# Patient Record
Sex: Female | Born: 1950 | Race: Black or African American | Hispanic: No | Marital: Single | Smoking: Never smoker
Health system: Southern US, Community
[De-identification: ages and names within clinical notes are randomized; demographics above are authoritative.]

## PROBLEM LIST (undated history)

## (undated) DIAGNOSIS — E119 Type 2 diabetes mellitus without complications: Secondary | ICD-10-CM

## (undated) DIAGNOSIS — I10 Essential (primary) hypertension: Secondary | ICD-10-CM

## (undated) DIAGNOSIS — E079 Disorder of thyroid, unspecified: Secondary | ICD-10-CM

## (undated) DIAGNOSIS — M5430 Sciatica, unspecified side: Secondary | ICD-10-CM

## (undated) HISTORY — DX: Disorder of thyroid, unspecified: E07.9

## (undated) HISTORY — DX: Essential (primary) hypertension: I10

## (undated) HISTORY — DX: Type 2 diabetes mellitus without complications: E11.9

---

## 2012-06-12 ENCOUNTER — Ambulatory Visit: Payer: Self-pay | Admitting: Family Medicine

## 2013-04-24 ENCOUNTER — Ambulatory Visit: Payer: Self-pay | Admitting: General Surgery

## 2013-05-19 ENCOUNTER — Encounter: Payer: Self-pay | Admitting: General Surgery

## 2013-05-19 ENCOUNTER — Telehealth: Payer: Self-pay

## 2013-05-19 ENCOUNTER — Ambulatory Visit (INDEPENDENT_AMBULATORY_CARE_PROVIDER_SITE_OTHER): Payer: Medicaid Other | Admitting: General Surgery

## 2013-05-19 VITALS — BP 150/72 | HR 70 | Resp 14 | Ht 61.0 in | Wt <= 1120 oz

## 2013-05-19 DIAGNOSIS — K8020 Calculus of gallbladder without cholecystitis without obstruction: Secondary | ICD-10-CM

## 2013-05-19 NOTE — Telephone Encounter (Signed)
Patient has been scheduled for surgery at [facility] on 05/30/13. She will go to pre [facility] 05/22/13 at 8:30 am. She is aware of dates, time, and all instructions.

## 2013-05-19 NOTE — Progress Notes (Signed)
Patient ID: Gina Huerta, female   DOB: [birth year], 63 y.o.   MRN: [id number]  Chief Complaint  Patient presents with  . Other    gallstones    HPI Gina Huerta is a 63 y.o. female. here today for a evalaution of gallstones. Patient had a in house ultrasound done in Dr. Melford AaseNieneyer office.Pt had pain from mid upper abdomen that wrapped around to right midback, describes it like a pulling sensation.  Pt points to RUQ as location of most severe pain. Rated 8/10 at onset of pain in March. Pt feels uncomfortable at bedtime and reports pain 10 minutes after most meals/dinners that lasts less than an hour. Pt unsure if there are any trigger foods. Pt takes Advil for pain relief. Pt denies nausea or vomiting.      HPI   Past Medical History  Diagnosis Date  . Diabetes mellitus without complication   . Hypertension   . Thyroid disease     History reviewed. No pertinent past surgical history.  History reviewed. No pertinent family history.  Social History History  Substance Use Topics  . Smoking status: Never Smoker   . Smokeless tobacco: Never Used  . Alcohol Use: No    No Known Allergies  Current Outpatient Prescriptions  Medication Sig Dispense Refill  . alendronate (FOSAMAX) 70 MG tablet Take 70 mg by mouth once a week. Take with a full glass of water on an empty stomach.      Marland Kitchen. amLODipine (NORVASC) 5 MG tablet Take 5 mg by mouth daily.      Marland Kitchen. aspirin 325 MG tablet Take 325 mg by mouth daily.      . carvedilol (COREG) 25 MG tablet Take 25 mg by mouth 2 (two) times daily with a meal.      . carvedilol (COREG) 3.125 MG tablet Take 3.125 mg by mouth 2 (two) times daily with a meal.      . cholecalciferol (VITAMIN D) 400 UNITS TABS tablet Take 400 Units by mouth.      . levothyroxine (SYNTHROID, LEVOTHROID) 25 MCG tablet Take 25 mcg by mouth daily before breakfast.      . lisinopril (PRINIVIL,ZESTRIL) 10 MG tablet Take 10 mg by mouth daily.      . metFORMIN (GLUCOPHAGE) 500 MG tablet Take  500 mg by mouth daily with breakfast.      . simvastatin (ZOCOR) 40 MG tablet Take 40 mg by mouth daily.      Marland Kitchen. tiZANidine (ZANAFLEX) 4 MG capsule Take 4 mg by mouth 3 (three) times daily.       No current facility-administered medications for this visit.    Review of Systems Review of Systems  Constitutional: Negative.  Negative for fever and chills.  Respiratory: Positive for cough.   Cardiovascular: Negative.   Gastrointestinal: Positive for abdominal pain. Negative for nausea, vomiting, diarrhea and constipation.  Musculoskeletal: Positive for back pain.    Blood pressure 150/72, pulse 70, resp. rate 14, height 5\' 1"  (1.549 m), weight 26 lb (11.794 kg).  Physical Exam Physical Exam  Constitutional: She is oriented to person, place, and time. She appears well-developed and well-nourished.  HENT:  Head: Normocephalic and atraumatic.  Eyes: Conjunctivae are normal. No scleral icterus.  Neck: Neck supple. No mass present.  Cardiovascular: Normal rate, regular rhythm and normal heart sounds.  Exam reveals no gallop and no friction rub.   No murmur heard. Pulmonary/Chest: Effort normal and breath sounds normal. No respiratory distress.  Abdominal: Soft. Normal appearance  and bowel sounds are normal. She exhibits no distension and no mass. There is no splenomegaly or hepatomegaly. There is tenderness in the right upper quadrant. There is no rigidity, no guarding and negative Murphy's sign.    Lymphadenopathy:    She has no cervical adenopathy.  Neurological: She is alert and oriented to person, place, and time.  Skin: Skin is warm and dry.    Data Reviewed Ultrasound reviewed-gallstones noted.  Assessment     Cholelithiasis, symptomatic.      Plan   Discussed  Laparoscopy and Cholecystectomy procedure, indications, risks, benefits. Pt agreeable.  Blood work includes CBC, CMP, and Lipase today.        Aricka Goldberger G Johanny Segers 05/19/2013, 1:22 PM

## 2013-05-19 NOTE — Addendum Note (Signed)
Addended by: Kieth BrightlySANKAR, SEEPLAPUTHUR G on: 05/19/2013 04:36 PM   Modules accepted: Orders

## 2013-05-19 NOTE — Patient Instructions (Signed)
Laparoscopic Cholecystectomy °Laparoscopic cholecystectomy is surgery to remove the gallbladder. The gallbladder is located in the upper right part of the abdomen, behind the liver. It is a storage sac for bile produced in the liver. Bile aids in the digestion and absorption of fats. Cholecystectomy is often done for inflammation of the gallbladder (cholecystitis). This condition is usually caused by a buildup of gallstones (cholelithiasis) in your gallbladder. Gallstones can block the flow of bile, resulting in inflammation and pain. In severe cases, emergency surgery may be required. When emergency surgery is not required, you will have time to prepare for the procedure. °Laparoscopic surgery is an alternative to open surgery. Laparoscopic surgery has a shorter recovery time. Your common bile duct may also need to be examined during the procedure. If stones are found in the common bile duct, they may be removed. °LET YOUR HEALTH CARE PROVIDER KNOW ABOUT: °· Any allergies you have. °· All medicines you are taking, including vitamins, herbs, eye drops, creams, and over-the-counter medicines. °· Previous problems you or members of your family have had with the use of anesthetics. °· Any blood disorders you have. °· Previous surgeries you have had. °· Medical conditions you have. °RISKS AND COMPLICATIONS °Generally, this is a safe procedure. However, as with any procedure, complications can occur. Possible complications include: °· Infection. °· Damage to the common bile duct, nerves, arteries, veins, or other internal organs such as the stomach, liver, or intestines. °· Bleeding. °· A stone may remain in the common bile duct. °· A bile leak from the cyst duct that is clipped when your gallbladder is removed. °· The need to convert to open surgery, which requires a larger incision in the abdomen. This may be necessary if your surgeon thinks it is not safe to continue with a laparoscopic procedure. °BEFORE THE  PROCEDURE °· Ask your health care provider about changing or stopping any regular medicines. You will need to stop taking aspirin or blood thinners at least 5 days prior to surgery. °· Do not eat or drink anything after midnight the night before surgery. °· Let your health care provider know if you develop a cold or other infectious problem before surgery. °PROCEDURE  °· You will be given medicine to make you sleep through the procedure (general anesthetic). A breathing tube will be placed in your mouth. °· When you are asleep, your surgeon will make several small cuts (incisions) in your abdomen. °· A thin, lighted tube with a tiny camera on the end (laparoscope) is inserted through one of the small incisions. The camera on the laparoscope sends a picture to a TV screen in the operating room. This gives the surgeon a good view inside your abdomen. °· A gas will be pumped into your abdomen. This expands your abdomen so that the surgeon has more room to perform the surgery. °· Other tools needed for the procedure are inserted through the other incisions. The gallbladder is removed through one of the incisions. °· After the removal of your gallbladder, the incisions will be closed with stitches, staples, or skin glue. °AFTER THE PROCEDURE °· You will be taken to a recovery area where your progress will be checked often. °· You may be allowed to go home the same day if your pain is controlled and you can tolerate liquids. °Document Released: 01/02/2005 Document Revised: 10/23/2012 Document Reviewed: 08/14/2012 °[employer]® Patient Information ©2014 [employer]. ° °

## 2013-05-20 LAB — COMPREHENSIVE METABOLIC PANEL
ALK PHOS: 80 IU/L (ref 39–117)
ALT: 9 IU/L (ref 0–32)
AST: 13 IU/L (ref 0–40)
Albumin/Globulin Ratio: 1.2 (ref 1.1–2.5)
Albumin: 4 g/dL (ref 3.6–4.8)
BILIRUBIN TOTAL: 0.3 mg/dL (ref 0.0–1.2)
BUN / CREAT RATIO: 19 (ref 11–26)
BUN: 13 mg/dL (ref 8–27)
CHLORIDE: 97 mmol/L (ref 97–108)
CO2: 27 mmol/L (ref 18–29)
Calcium: 9.7 mg/dL (ref 8.7–10.3)
Creatinine, Ser: 0.68 mg/dL (ref 0.57–1.00)
GFR calc non Af Amer: 93 mL/min/{1.73_m2} (ref >59)
GFR, EST AFRICAN AMERICAN: 107 mL/min/{1.73_m2} (ref >59)
GLUCOSE: 136 mg/dL — AB (ref 65–99)
Globulin, Total: 3.4 g/dL (ref 1.5–4.5)
Potassium: 4.5 mmol/L (ref 3.5–5.2)
SODIUM: 142 mmol/L (ref 134–144)
Total Protein: 7.4 g/dL (ref 6.0–8.5)

## 2013-05-20 LAB — CBC WITH DIFFERENTIAL
BASOS ABS: 0 10*3/uL (ref 0.0–0.2)
Basos: 0 %
EOS ABS: 0.2 10*3/uL (ref 0.0–0.4)
Eos: 2 %
HCT: 35.1 % (ref 34.0–46.6)
Hemoglobin: 11.3 g/dL (ref 11.1–15.9)
IMMATURE GRANULOCYTES: 0 %
Immature Grans (Abs): 0 10*3/uL (ref 0.0–0.1)
LYMPHS: 24 %
Lymphocytes Absolute: 3.2 10*3/uL — ABNORMAL HIGH (ref 0.7–3.1)
MCH: 27.5 pg (ref 26.6–33.0)
MCHC: 32.2 g/dL (ref 31.5–35.7)
MCV: 85 fL (ref 79–97)
Monocytes Absolute: 0.9 10*3/uL (ref 0.1–0.9)
Monocytes: 7 %
Neutrophils Absolute: 8.8 10*3/uL — ABNORMAL HIGH (ref 1.4–7.0)
Neutrophils Relative %: 67 %
PLATELETS: 237 10*3/uL (ref 150–379)
RBC: 4.11 x10E6/uL (ref 3.77–5.28)
RDW: 13.8 % (ref 12.3–15.4)
WBC: 13.2 10*3/uL — ABNORMAL HIGH (ref 3.4–10.8)

## 2013-05-21 LAB — LIPASE: LIPASE: 40 U/L (ref 0–59)

## 2013-05-21 LAB — SPECIMEN STATUS REPORT

## 2013-05-22 ENCOUNTER — Ambulatory Visit: Payer: Self-pay | Admitting: General Surgery

## 2013-05-30 ENCOUNTER — Ambulatory Visit: Payer: Self-pay | Admitting: General Surgery

## 2013-05-30 ENCOUNTER — Encounter: Payer: Self-pay | Admitting: General Surgery

## 2013-05-30 DIAGNOSIS — K8010 Calculus of gallbladder with chronic cholecystitis without obstruction: Secondary | ICD-10-CM

## 2013-05-30 HISTORY — PX: CHOLECYSTECTOMY: SHX55

## 2013-06-03 LAB — PATHOLOGY REPORT

## 2013-06-04 ENCOUNTER — Encounter: Payer: Self-pay | Admitting: General Surgery

## 2013-06-12 ENCOUNTER — Ambulatory Visit (INDEPENDENT_AMBULATORY_CARE_PROVIDER_SITE_OTHER): Payer: Self-pay | Admitting: General Surgery

## 2013-06-12 ENCOUNTER — Encounter: Payer: Self-pay | Admitting: General Surgery

## 2013-06-12 VITALS — BP 148/78 | HR 72 | Resp 14 | Ht 61.0 in | Wt 223.0 lb

## 2013-06-12 DIAGNOSIS — K8020 Calculus of gallbladder without cholecystitis without obstruction: Secondary | ICD-10-CM

## 2013-06-12 NOTE — Progress Notes (Signed)
This is a 63 year old female here today for her post op gallbladder surgery done on 05/30/13. Patient states she is doing well.  Port sites are clean. Abdomen is soft and non tender.  Path- chronic cholecystitis, cholelithiasis. RTC prn. Pt  may resume normal activity

## 2013-06-12 NOTE — Patient Instructions (Signed)
Patient to return as needed. 

## 2013-07-31 ENCOUNTER — Ambulatory Visit: Payer: Self-pay | Admitting: Family Medicine

## 2013-10-06 ENCOUNTER — Ambulatory Visit: Payer: Self-pay | Admitting: Gastroenterology

## 2013-11-17 ENCOUNTER — Encounter: Payer: Self-pay | Admitting: General Surgery

## 2014-05-09 NOTE — Op Note (Signed)
PATIENT NAME:  Gina Huerta, Gina Huerta MR#:  [id number] DATE OF BIRTH:  Dec 18, 1950  DATE OF PROCEDURE:  05/30/2013.  PREOPERATIVE DIAGNOSIS: Cholelithiasis.  POSTOPERATIVE DIAGNOSIS:  Chronic cholecystitis and cholelithiasis.   OPERATION:  Laparoscopy, cholecystectomy with cholangiogram.   SURGEON:  Kathreen CosierS. G. Sankar, M.Huerta.   ANESTHESIA:  General.   COMPLICATIONS:  None.   ESTIMATED BLOOD LOSS:  Less than 25 mL.   DRAINS:  None.   PROCEDURE:  The patient was put to sleep in the supine position on the operating table. The abdomen was prepped and draped out as a sterile field. Time-out was performed. An incision was made a little bit above the umbilicus. The patient had a very long distance to the subcostal region from the umbilicus. A transverse incision was made and the fascia was palpated and lifted up with a tracheal hook and a Veress needle position in the peritoneal cavity verified with the hanging drop method and pneumoperitoneum was obtained. A 10-mm port was placed. The camera was introduced. There was good visualization of the peritoneal cavity. Subsequent in the epigastric and 2 lateral 5-mm ports were placed. The gallbladder was noted to be moderately thickened in its wall and the distended but with no acute changes. With careful cephalad traction, the Hartmann's pouch was pulled up and the cystic duct area was dissected. A few adhesions here were taken down with cautery. The cystic duct was then freed. An anterior branch of the cystic artery was identified which was freed, hemoclipped and cut. Kumar clamp and catheter was positioned and cholangiogram was performed. There was good filling of the bile duct, both proximal and distal, with no evidence of filling defect or obstruction to flow. The catheter was used to decompress the gallbladder containing extremely thick bile with containing some tiny fragments suggestive of tiny stones. The cystic duct was hemoclipped and cut. The cystic artery was  identified, freed, hemoclipped and cut. The gallbladder was dissected free from its bed using cautery for control of bleeding. It was then placed in a retrieval bag and brought out through the umbilical port site. The gallbladder had multiple, 1 to 1.5 cm faceted stones, some of it had to be removed before the gallbladder could be delivered through the port site. Following this, the fascial opening at the umbilicus was closed with 2 figure-of-eight stitches of 0 Vicryl. The right upper quadrant was inspected and fluid used to irrigate out this area and suctioned out. Hemostasis was intact. The remaining ports were removed and pneumoperitoneum released. Skin incisions were closed with subcuticular 4-0 Vicryl, reinforced with Steri-Strips and dry sterile dressing placed. The patient tolerated the procedure well. There were no immediate problems encountered. She was extubated and returned to the recovery room in stable condition.   ____________________________ S.Wynona LunaG. Sankar, MD sgs:jm Huerta: 05/30/2013 10:52:09 ET T: 05/30/2013 11:37:51 ET JOB#: [id number]  cc: S.G. Evette CristalSankar, MD, <Dictator> [facility] Wynona LunaG SANKAR MD ELECTRONICALLY SIGNED 06/02/2013 7:08

## 2016-01-05 IMAGING — CR DG CHOLANGIOGRAM OPERATIVE
1 series · 1 of 1 positions shown · non-contrast
Comparison: None.

CLINICAL DATA: Cholelithiasis

EXAM:
INTRAOPERATIVE CHOLANGIOGRAM
TECHNIQUE: Cholangiographic image from the C-arm fluoroscopic device submitted
for interpretation post-operatively. Please see the procedural
report for the amount of contrast and the fluoroscopy time utilized.

[s1]
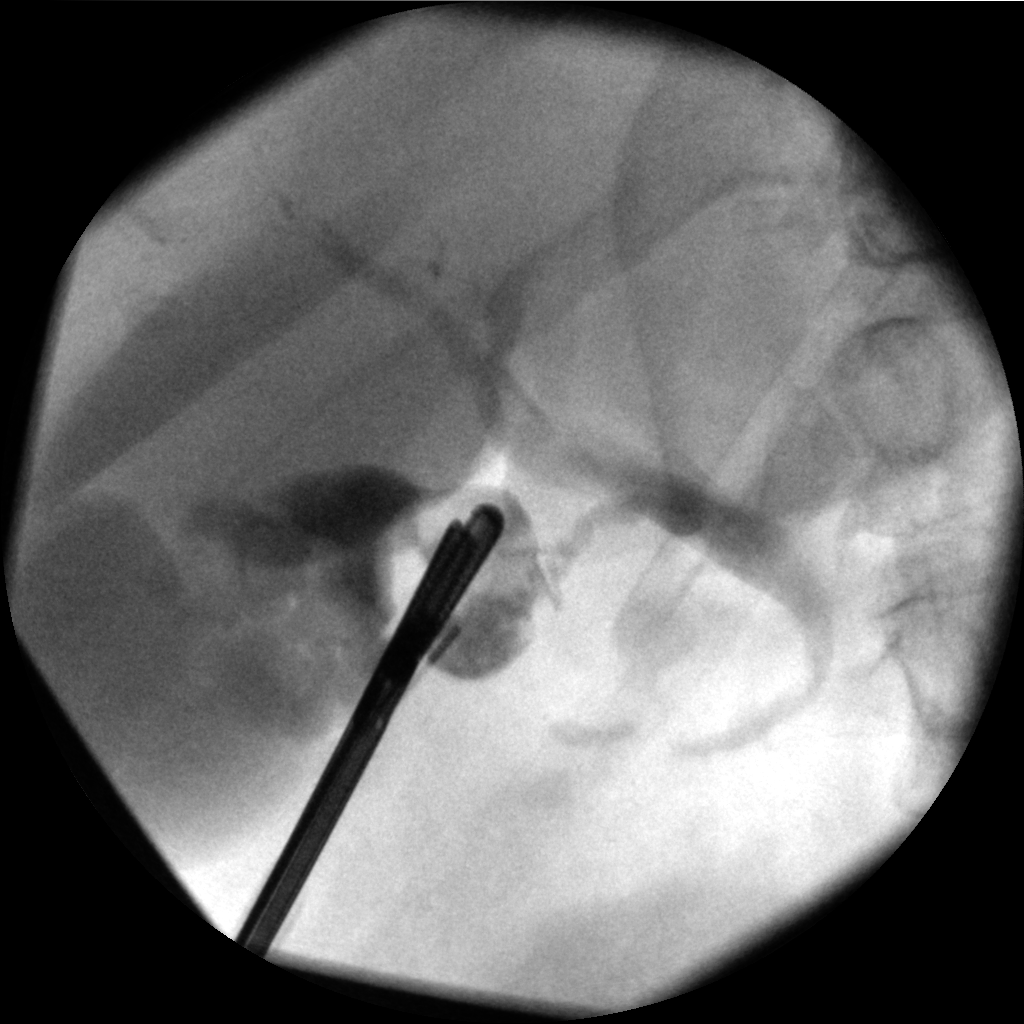

[1 of 1 positions shown; findings below may reference images not displayed]

FINDINGS: Incomplete opacification of the common duct. Intrahepatic ducts are
incompletely visualized, appearing decompressed centrally. No
convincing Contrast into the duodenum.

:
Limited assessment for retained common duct stone.

## 2016-09-10 ENCOUNTER — Emergency Department
Admission: EM | Admit: 2016-09-10 | Discharge: 2016-09-10 | Disposition: A | Discharge status: Home / Self Care | Payer: Medicare Other | Attending: Emergency Medicine | Admitting: Emergency Medicine

## 2016-09-10 ENCOUNTER — Encounter: Payer: Self-pay | Admitting: Emergency Medicine

## 2016-09-10 DIAGNOSIS — Z7982 Long term (current) use of aspirin: Secondary | ICD-10-CM | POA: Diagnosis not present

## 2016-09-10 DIAGNOSIS — Z91010 Allergy to peanuts: Secondary | ICD-10-CM | POA: Diagnosis not present

## 2016-09-10 DIAGNOSIS — M5432 Sciatica, left side: Secondary | ICD-10-CM

## 2016-09-10 DIAGNOSIS — E119 Type 2 diabetes mellitus without complications: Secondary | ICD-10-CM | POA: Insufficient documentation

## 2016-09-10 DIAGNOSIS — I10 Essential (primary) hypertension: Secondary | ICD-10-CM | POA: Insufficient documentation

## 2016-09-10 DIAGNOSIS — Z79899 Other long term (current) drug therapy: Secondary | ICD-10-CM | POA: Insufficient documentation

## 2016-09-10 HISTORY — DX: Sciatica, unspecified side: M54.30

## 2016-09-10 MED ORDER — MORPHINE SULFATE (PF) 2 MG/ML IV SOLN
2.0000 mg | Freq: Once | INTRAVENOUS | Status: AC
  Administered 2016-09-10: 2 mg via INTRAVENOUS
  Filled 2016-09-10: qty 1

## 2016-09-10 MED ORDER — CYCLOBENZAPRINE HCL 10 MG PO TABS: 10.0000 mg | ORAL_TABLET | Freq: Three times a day (TID) | ORAL | 0 refills | Status: AC | PRN

## 2016-09-10 MED ORDER — ONDANSETRON HCL 4 MG/2ML IJ SOLN
4.0000 mg | Freq: Once | INTRAMUSCULAR | Status: AC
  Administered 2016-09-10: 4 mg via INTRAVENOUS
  Filled 2016-09-10: qty 2

## 2016-09-10 MED ORDER — KETOROLAC TROMETHAMINE 30 MG/ML IJ SOLN
30.0000 mg | Freq: Once | INTRAMUSCULAR | Status: AC
  Administered 2016-09-10: 30 mg via INTRAVENOUS
  Filled 2016-09-10: qty 1

## 2016-09-10 NOTE — ED Notes (Signed)
Pt assisted out of car by police officer. Pt complains of left hip pain that radiates to back and down left leg.

## 2016-09-10 NOTE — ED Notes (Signed)
Pt. States she is in town visiting family.  Pt. States hx of sciatica.  Pt. States pain first started Wednesday and has been keeping pain under control with ibuprohen.  Pt. States yesterday morning lt. Leg was in too much pain to walk.  Pt. States pain starts in lower lt. Back and radiates down to left foot.  Daughter in room with pt.

## 2016-09-10 NOTE — ED Triage Notes (Signed)
Pt c/o pain to left glut radiating down the back of her left leg for 5 days; history of sciatica and says this feels the same; rates pain 10/10; pt says when pain started it was tolerable but has now progressed to 10/10; pain with any movement; only time she can get some relief is when she is not moving at all; a friend gave her a "natural" muscle relaxer which provided no relief;

## 2016-09-10 NOTE — ED Notes (Signed)
ED Provider at bedside. 

## 2016-09-10 NOTE — ED Provider Notes (Signed)
[facility] Emergency Department Provider Note    First MD Initiated Contact with Patient 09/10/16 [phone]     (approximate)  I have reviewed the triage vital signs and the nursing notes.   HISTORY  Chief Complaint Back Pain and Leg Pain   HPI Gina Huerta is a 66 y.o. female with the below list of chronic medical conditions including sciatica presents to the emergency department 10 out of 10 low back pain with radiation down the left buttocks and leg. Patient denies any known injury. Patient states symptoms consistent with previous episodes of sciatica. Patient denies any urinary incontinence or fecal incontinence. Patient denies any lower extremity weakness or numbness.   Past Medical History:  Diagnosis Date  . Diabetes mellitus without complication (HCC)   . Hypertension   . Sciatica   . Thyroid disease     There are no active problems to display for this patient.   Past Surgical History:  Procedure Laterality Date  . CHOLECYSTECTOMY  05/30/13    Prior to Admission medications   Medication Sig Start Date End Date Taking? Authorizing Provider  alendronate (FOSAMAX) 70 MG tablet Take 70 mg by mouth once a week. Take with a full glass of water on an empty stomach.    [provider]  amLODipine (NORVASC) 5 MG tablet Take 5 mg by mouth daily.    [provider]  aspirin 325 MG tablet Take 325 mg by mouth daily.    [provider]  carvedilol (COREG) 25 MG tablet Take 25 mg by mouth 2 (two) times daily with a meal.    [provider]  carvedilol (COREG) 3.125 MG tablet Take 3.125 mg by mouth 2 (two) times daily with a meal.    [provider]  cholecalciferol (VITAMIN D) 400 UNITS TABS tablet Take 400 Units by mouth.    [provider]  levothyroxine (SYNTHROID, LEVOTHROID) 25 MCG tablet Take 25 mcg by mouth daily before breakfast.    [provider]  lisinopril (PRINIVIL,ZESTRIL) 10 MG  tablet Take 10 mg by mouth daily.    [provider]  metFORMIN (GLUCOPHAGE) 500 MG tablet Take 500 mg by mouth daily with breakfast.    [provider]  simvastatin (ZOCOR) 40 MG tablet Take 40 mg by mouth daily.    [provider]  tiZANidine (ZANAFLEX) 4 MG capsule Take 4 mg by mouth 3 (three) times daily.    [provider]    Allergies Peanut-containing drug products  History reviewed. No pertinent family history.  Social History Social History  Substance Use Topics  . Smoking status: Never Smoker  . Smokeless tobacco: Never Used  . Alcohol use No    Review of Systems Constitutional: No fever/chills Eyes: No visual changes. ENT: No sore throat. Cardiovascular: Denies chest pain. Respiratory: Denies shortness of breath. Gastrointestinal: No abdominal pain.  No nausea, no vomiting.  No diarrhea.  No constipation. Genitourinary: Negative for dysuria. Musculoskeletal: Negative for neck pain.  Positive for back pain. Integumentary: Negative for rash. Neurological: Negative for headaches, focal weakness or numbness.   ____________________________________________   PHYSICAL EXAM:  VITAL SIGNS: ED Triage Vitals  Enc Vitals Group     BP 09/10/16 0356 (!) 178/55     Pulse Rate 09/10/16 0356 88     Resp 09/10/16 0356 18     Temp 09/10/16 0356 98.6 F (37 C)     Temp Source 09/10/16 0356 Oral     SpO2  09/10/16 0356 98 %     Weight 09/10/16 0358 112.5 kg (248 lb)     Height 09/10/16 0358 1.549 m (5\' 1" )     Head Circumference --      Peak Flow --      Pain Score 09/10/16 0355 10     Pain Loc --      Pain Edu? --      Excl. in GC? --     Constitutional: Alert and oriented. Well appearing and in no acute distress. Eyes: Conjunctivae are normal. PERRL. EOMI. Head: Atraumatic. Mouth/Throat: Mucous membranes are moist.  Oropharynx non-erythematous. Neck: No stridor.   Cardiovascular: Normal rate, regular rhythm. Good peripheral  circulation. Grossly normal heart sounds. Respiratory: Normal respiratory effort.  No retractions. Lungs CTAB. Gastrointestinal: Soft and nontender. No distention.  Musculoskeletal: Pain with left lumbar paraspinal muscle palpation.  Neurologic:  Normal speech and language. No gross focal neurologic deficits are appreciated.  Skin:  Skin is warm, dry and intact. No rash noted. Psychiatric: Mood and affect are normal. Speech and behavior are normal.     Procedures   ____________________________________________   INITIAL IMPRESSION / ASSESSMENT AND PLAN / ED COURSE  Pertinent labs & imaging results that were available during my care of the patient were reviewed by me and considered in my medical decision making (see chart for details).  Patient given Toradol 30 mg IV and 2 mg of morphine with complete resolution of pain  66 year old female with known history of sciatica presenting to the emergency department with history consistent with the same.     ____________________________________________  FINAL CLINICAL IMPRESSION(S) / ED DIAGNOSES  Final diagnoses:  Sciatica of left side     MEDICATIONS GIVEN DURING THIS VISIT:  Medications  ketorolac (TORADOL) 30 MG/ML injection 30 mg (not administered)  morphine 2 MG/ML injection 2 mg (not administered)  ondansetron (ZOFRAN) injection 4 mg (not administered)     NEW OUTPATIENT MEDICATIONS STARTED DURING THIS VISIT:  New Prescriptions   No medications on file    Modified Medications   No medications on file    Discontinued Medications   No medications on file     Note:  This document was prepared using Dragon voice recognition software and may include unintentional dictation errors.    Darci Current, MD 09/10/16 [phone]

## 2016-09-10 NOTE — ED Notes (Signed)
Pt sitting in lobby in wheelchair with family in no acute distress.

## 2016-09-10 NOTE — ED Notes (Signed)
Pt. States she also takes meloxicam, but forgot to bring it with her on this trip.

## 2016-09-11 ENCOUNTER — Emergency Department
Admission: EM | Admit: 2016-09-11 | Discharge: 2016-09-12 | Disposition: A | Discharge status: Home / Self Care | Payer: Medicare Other | Attending: Emergency Medicine | Admitting: Emergency Medicine

## 2016-09-11 ENCOUNTER — Emergency Department: Payer: Medicare Other

## 2016-09-11 ENCOUNTER — Encounter: Payer: Self-pay | Admitting: *Deleted

## 2016-09-11 DIAGNOSIS — M5432 Sciatica, left side: Secondary | ICD-10-CM

## 2016-09-11 DIAGNOSIS — E119 Type 2 diabetes mellitus without complications: Secondary | ICD-10-CM | POA: Insufficient documentation

## 2016-09-11 DIAGNOSIS — Z7984 Long term (current) use of oral hypoglycemic drugs: Secondary | ICD-10-CM | POA: Diagnosis not present

## 2016-09-11 DIAGNOSIS — I10 Essential (primary) hypertension: Secondary | ICD-10-CM | POA: Insufficient documentation

## 2016-09-11 DIAGNOSIS — Z91010 Allergy to peanuts: Secondary | ICD-10-CM | POA: Diagnosis not present

## 2016-09-11 DIAGNOSIS — Z79899 Other long term (current) drug therapy: Secondary | ICD-10-CM | POA: Diagnosis not present

## 2016-09-11 MED ORDER — TRAMADOL HCL 50 MG PO TABS: 50.0000 mg | ORAL_TABLET | Freq: Four times a day (QID) | ORAL | 0 refills | Status: AC | PRN

## 2016-09-11 MED ORDER — PREDNISONE 20 MG PO TABS: 40.0000 mg | ORAL_TABLET | Freq: Every day | ORAL | 0 refills | Status: AC

## 2016-09-11 MED ORDER — MORPHINE SULFATE (PF) 2 MG/ML IV SOLN
2.0000 mg | Freq: Once | INTRAVENOUS | Status: AC
  Administered 2016-09-11: 2 mg via INTRAMUSCULAR
  Filled 2016-09-11: qty 1

## 2016-09-11 MED ORDER — OXYCODONE HCL 5 MG PO TABS
5.0000 mg | ORAL_TABLET | Freq: Once | ORAL | Status: AC
  Administered 2016-09-11: 5 mg via ORAL
  Filled 2016-09-11: qty 1

## 2016-09-11 MED ORDER — ACETAMINOPHEN 500 MG PO TABS
1000.0000 mg | ORAL_TABLET | Freq: Once | ORAL | Status: AC
  Administered 2016-09-11: 1000 mg via ORAL
  Filled 2016-09-11: qty 2

## 2016-09-11 MED ORDER — PREDNISONE 20 MG PO TABS
40.0000 mg | ORAL_TABLET | Freq: Once | ORAL | Status: AC
  Administered 2016-09-11: 40 mg via ORAL
  Filled 2016-09-11: qty 2

## 2016-09-11 MED ORDER — KETOROLAC TROMETHAMINE 60 MG/2ML IM SOLN
30.0000 mg | Freq: Once | INTRAMUSCULAR | Status: AC
  Administered 2016-09-11: 30 mg via INTRAMUSCULAR
  Filled 2016-09-11: qty 2

## 2016-09-11 MED ORDER — PREDNISONE 20 MG PO TABS: 60.0000 mg | ORAL_TABLET | Freq: Once | ORAL | Status: DC

## 2016-09-11 NOTE — ED Notes (Signed)
meds given.  Family with pt.  

## 2016-09-11 NOTE — ED Notes (Signed)
md in with pt now. Pt continues to have pain.

## 2016-09-11 NOTE — ED Notes (Signed)
Pt has left hip/back pain.  Pt states she started flexeril today.  Pt denies urinary sx.  Pt seen in er 2 days ago.  Pt alert.  Speech clerar. No known injury . md at bedside.

## 2016-09-11 NOTE — Discharge Instructions (Signed)
Take prednisone as prescribed. Take 1000 mg (2 extra strength) Tylenol every 8 hours. You may take 1 to tramadol every 6 hours for breakthrough pain. Follow up with your primary care doctor. Return to the emergency room if you have new back pain, weakness or numbness of your legs, urinary or bowel incontinence, difficulty urinating, constipation, abdominal pain, numbness in your groin area, or any new symptoms that are concerning to you.

## 2016-09-11 NOTE — ED Notes (Addendum)
Report off to vanessa rn 

## 2016-09-11 NOTE — ED Triage Notes (Signed)
Pt brought in via ems from home with leftlower back pain.  Pt dx with sciatica 2 days ago in er and started flexeril today.  No pain meds.  Pt denies urinary sx. Pt alert.

## 2016-09-11 NOTE — ED Provider Notes (Signed)
[facility] Emergency Department Provider Note  ____________________________________________  Time seen: Approximately 7:59 PM  I have reviewed the triage vital signs and the nursing notes.   HISTORY  Chief Complaint Back Pain   HPI SAMIYA MERVIN is a 66 y.o. female with a history of diabetes, hypertension, sciatica who presents for evaluation of exacerbation of her sciatica pain. Patient was seen hereyesterday. She was discharged home on Flexeril which only started this evening. She now has had 5 days of pain that starts in the left buttock region and radiates down to the left leg. The pain is mild and sometimes absent at rest but becomes severe with any movement of the leg. No saddle anesthesia, no back pain, no weakness or numbness of her leg, no urinary or bowel incontinence or retention, no trauma, no fevers, no unintentional weight loss. Patient reports that the pain is identical to her prior episodes of sciatica however more severe. In the past she was treated with steroids with resolution of her symptoms. She denies dysuria or hematuria, abdominal pain, nausea or vomiting  Past Medical History:  Diagnosis Date  . Diabetes mellitus without complication (HCC)   . Hypertension   . Sciatica   . Thyroid disease     There are no active problems to display for this patient.   Past Surgical History:  Procedure Laterality Date  . CHOLECYSTECTOMY  05/30/13    Prior to Admission medications   Medication Sig Start Date End Date Taking? Authorizing Provider  alendronate (FOSAMAX) 70 MG tablet Take 70 mg by mouth once a week. Take with a full glass of water on an empty stomach.    [provider]  amLODipine (NORVASC) 5 MG tablet Take 5 mg by mouth daily.    [provider]  aspirin 325 MG tablet Take 325 mg by mouth daily.    [provider]  carvedilol (COREG) 25 MG tablet Take 25 mg by mouth 2 (two) times daily with a meal.     [provider]  carvedilol (COREG) 3.125 MG tablet Take 3.125 mg by mouth 2 (two) times daily with a meal.    [provider]  cholecalciferol (VITAMIN D) 400 UNITS TABS tablet Take 400 Units by mouth.    [provider]  cyclobenzaprine (FLEXERIL) 10 MG tablet Take 1 tablet (10 mg total) by mouth 3 (three) times daily as needed. 09/10/16   Darci Current, MD  levothyroxine (SYNTHROID, LEVOTHROID) 25 MCG tablet Take 25 mcg by mouth daily before breakfast.    [provider]  lisinopril (PRINIVIL,ZESTRIL) 10 MG tablet Take 10 mg by mouth daily.    [provider]  metFORMIN (GLUCOPHAGE) 500 MG tablet Take 500 mg by mouth daily with breakfast.    [provider]  predniSONE (DELTASONE) 20 MG tablet Take 2 tablets (40 mg total) by mouth daily. 09/11/16 09/16/16  Nita Sickle, MD  simvastatin (ZOCOR) 40 MG tablet Take 40 mg by mouth daily.    [provider]  tiZANidine (ZANAFLEX) 4 MG capsule Take 4 mg by mouth 3 (three) times daily.    [provider]  traMADol (ULTRAM) 50 MG tablet Take 1 tablet (50 mg total) by mouth every 6 (six) hours as needed. 09/11/16 09/11/17  Nita Sickle, MD    Allergies Peanut-containing drug products  No family history on file.  Social History Social History  Substance Use Topics  . Smoking status: Never Smoker  . Smokeless tobacco: Never Used  .  Alcohol use No    Review of Systems  Constitutional: Negative for fever. Eyes: Negative for visual changes. ENT: Negative for sore throat. Neck: No neck pain  Cardiovascular: Negative for chest pain. Respiratory: Negative for shortness of breath. Gastrointestinal: Negative for abdominal pain, vomiting or diarrhea. Genitourinary: Negative for dysuria. Musculoskeletal: + back pain. Skin: Negative for rash. Neurological: Negative for headaches, weakness or numbness. Psych: No SI or  HI  ____________________________________________   PHYSICAL EXAM:  VITAL SIGNS: ED Triage Vitals  Enc Vitals Group     BP 09/11/16 1952 (!) 150/52     Pulse Rate 09/11/16 1952 91     Resp 09/11/16 1952 20     Temp 09/11/16 1952 98.6 F (37 C)     Temp Source 09/11/16 1952 Oral     SpO2 09/11/16 1952 99 %     Weight 09/11/16 1950 248 lb (112.5 kg)     Height 09/11/16 1950 5\' 1"  (1.549 m)     Head Circumference --      Peak Flow --      Pain Score 09/11/16 1949 5     Pain Loc --      Pain Edu? --      Excl. in GC? --     Constitutional: Alert and oriented. Well appearing and in no apparent distress. HEENT:      Head: Normocephalic and atraumatic.         Eyes: Conjunctivae are normal. Sclera is non-icteric.       Mouth/Throat: Mucous membranes are moist.       Neck: Supple with no signs of meningismus. Cardiovascular: Regular rate and rhythm. No murmurs, gallops, or rubs. 2+ symmetrical distal pulses are present in all extremities. No JVD. Respiratory: Normal respiratory effort. Lungs are clear to auscultation bilaterally. No wheezes, crackles, or rhonchi.  Gastrointestinal: Soft, non tender, and non distended with positive bowel sounds. No rebound or guarding. Musculoskeletal: left lower extremity is warm and well perfused, strong distal PT and DP pulses,intact strength and sensation, patient has significant tenderness to palpation at the left sciatic notch with positive straight leg raise. No midline CT and L-spine tenderness.Soft compartments. No bruising or trauma. Neurologic: Normal speech and language. Face is symmetric. Moving all extremities. No gross focal neurologic deficits are appreciated. Skin: Skin is warm, dry and intact. No rash noted. Psychiatric: Mood and affect are normal. Speech and behavior are normal.  ____________________________________________   LABS (all labs ordered are listed, but only abnormal results are displayed)  Labs Reviewed - No data to  display ____________________________________________  EKG  none  ____________________________________________  RADIOLOGY  XR pelvis and hip: negative ____________________________________________   PROCEDURES  Procedure(s) performed: None Procedures Critical Care performed:  None ____________________________________________   INITIAL IMPRESSION / ASSESSMENT AND PLAN / ED COURSE  66 y.o. female with a history of diabetes, hypertension, sciatica who presents for evaluation of exacerbation of her sciatica pain. Patient has a very benign exam with no signs or symptoms of cauda equina, no CT and L-spine tenderness, neurologically intact with normal reflexes of her bilateral lower extremities. She does have positive straight leg raise and significant tenderness on palpation of the left sciatic notch) patient literally jumped from the bed when I pushed there).leg is warm, well-perfused, with strong pulses, no evidence of claudication or dissection. There is no pain at rest, no swelling, no risk factors for DVT. We'll start patient on steroids, give her IM dose of Toradol and morphine and reassess.  _________________________ 10:52 PM on 09/11/2016 -----------------------------------------  Patient continues to complain of significant pain with movement. X-ray of pelvis and hip negative. Patient given Tylenol and oxycodone. Will ambulate and dc home on prednisone, tylenol, and tramadol  Pertinent labs & imaging results that were available during my care of the patient were reviewed by me and considered in my medical decision making (see chart for details).    ____________________________________________   FINAL CLINICAL IMPRESSION(S) / ED DIAGNOSES  Final diagnoses:  Sciatica of left side      NEW MEDICATIONS STARTED DURING THIS VISIT:  New Prescriptions   PREDNISONE (DELTASONE) 20 MG TABLET    Take 2 tablets (40 mg total) by mouth daily.   TRAMADOL (ULTRAM) 50 MG TABLET     Take 1 tablet (50 mg total) by mouth every 6 (six) hours as needed.     Note:  This document was prepared using Dragon voice recognition software and may include unintentional dictation errors.    Don Perking, [place], MD 09/11/16 [phone]
